# Patient Record
Sex: Female | Born: 2000 | Race: White | Hispanic: No | Marital: Single | Smoking: Never smoker
Health system: Southern US, Community
[De-identification: ages and names within clinical notes are randomized; demographics above are authoritative.]

## PROBLEM LIST (undated history)

## (undated) DIAGNOSIS — F329 Major depressive disorder, single episode, unspecified: Secondary | ICD-10-CM

## (undated) DIAGNOSIS — F32A Depression, unspecified: Secondary | ICD-10-CM

## (undated) HISTORY — DX: Major depressive disorder, single episode, unspecified: F32.9

## (undated) HISTORY — DX: Depression, unspecified: F32.A

---
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
## 2019-12-31 ENCOUNTER — Ambulatory Visit (INDEPENDENT_AMBULATORY_CARE_PROVIDER_SITE_OTHER): Payer: 59 | Admitting: Psychiatry

## 2019-12-31 ENCOUNTER — Other Ambulatory Visit: Payer: Self-pay

## 2019-12-31 ENCOUNTER — Encounter (HOSPITAL_COMMUNITY): Payer: Self-pay | Admitting: Psychiatry

## 2019-12-31 VITALS — Wt 142.0 lb

## 2019-12-31 DIAGNOSIS — F319 Bipolar disorder, unspecified: Secondary | ICD-10-CM

## 2019-12-31 DIAGNOSIS — F419 Anxiety disorder, unspecified: Secondary | ICD-10-CM | POA: Diagnosis not present

## 2019-12-31 MED ORDER — FLUOXETINE HCL 10 MG PO CAPS: ORAL_CAPSULE | ORAL | 0 refills | Status: DC

## 2019-12-31 MED ORDER — ARIPIPRAZOLE (SENSOR) 10 MG PO TABS: 10.0000 mg | ORAL_TABLET | Freq: Every day | ORAL | 0 refills | Status: DC

## 2019-12-31 NOTE — Progress Notes (Signed)
Virtual Visit via Video Note  I connected with Ann French on 12/31/19 at  9:00 AM EDT by a video enabled telemedicine application and verified that I am speaking with the correct person using two identifiers.   I discussed the limitations of evaluation and management by telemedicine and the availability of in person appointments. The patient expressed understanding and agreed to proceed.   [facility] Behavioral Health Initial Assessment Note  Ann French [id number] 19 y.o.  12/31/2019 9:44 AM  Chief Complaint:  I have a lot of anxiety and depression.  History of Present Illness:  Ann French is 19 year old Caucasian, single, employed female with a history of psychiatric symptoms referred by herself for seeking management.  Patient is struggled with mood swings, irritability, anxiety, depression since high school.  She took her medicine while she was in the high school from an [place] [facility] counseling but stopped after she felt that did not work.  Her symptoms started again last September when she noticed having extreme anxiety, severe mood swing, irritability and having crying spells with passive suicidal thoughts and feeling hopeless and helpless.  She also reported having manic symptoms which he described spending money, short temper, severe mood swings, yelling and screaming on the family members.  She had an argument with her grandpa while at the beach trip in October.  Patient reported she was supporting black life matters and argument that he took and she stopped and cut down her social network.  In January her boyfriend broke up because "he wanted space".  Patient recalled that she was begging for attention at the time.  She admitted easily emotional and highs and lows and get easily agitated and irritable.  She had spent almost all her savings and she regret about it.  Few months ago her PCP started her on Abilify and she is taking 5 mg.  Patient reported her mood swing irritability and  spinning is better but she still feels very anxious nervous depressed and having crying spells.  She sleep on and off.  Her energy level is low.  She feels hopeless and helpless and having passive suicidal thoughts but no plan or any intent.  She realized that she have underlying mood disorder.  She also reported drinking heavily sometimes with intoxication but denies any withdrawals, seizures.  She denies any other drug use.  Patient has a strong family history of psychiatric illness.  Her parents and brother has anxiety and depression.  She is hoping to start college again in fall after she dropped her semester last year.  She is moving in June at [place] in [place].  She is open to try medication adjustment.  Currently she is not seeing any therapist but agreed to see a counselor if needed.  Past Psychiatric History: History of depression, anxiety and mood symptoms since high school.  Seen [facility] counseling by and daily and therapist Beth and prescribed Lexapro for 1 year, Zoloft for 1 year and Effexor for 2 months.  She was also given Ritalin and trazodone by PCP.  She recalled none of these medicine help.  She is taking Abilify prescribed by PCP for past 2 months.  No history of suicidal attempt, inpatient.  History of drinking 2-3 times a week.  Admitted intoxication but denies seizures, blackouts, withdrawal, DUI.  Family History: Parents and brother has anxiety and depression.  Past Medical History:  Diagnosis Date  . Depression      Traumatic brain injury: No history of traumatic brain injury.  Work History; Looking at [employer].  Like her job.  Coworker are supportive.  Psychosocial History; Patient lives with her parents.  Recently boyfriend broke up in January 2021.  She is going to start school at [employer] in [place] coming fall.  She is hoping to move in June to have her own place.  Legal History; Denies any legal issues.  Substance Abuse History; History of  drinking 2-3 times a week.  History of intoxication but no blackouts, seizures, DUI or seizures.  Neurologic: Headache: Yes Seizure: No Paresthesias: No   Outpatient Encounter Medications as of 12/31/2019  Medication Sig  . ARIPiprazole (ABILIFY) 5 MG tablet Take 5 mg by mouth daily.  . medroxyPROGESTERone (DEPO-PROVERA) 150 MG/ML injection Inject 150 mg into the muscle every 3 (three) months.  . [DISCONTINUED] escitalopram (LEXAPRO) 10 MG tablet Take 10 mg by mouth daily.  . [DISCONTINUED] methylphenidate 18 MG PO CR tablet Take 18 mg by mouth daily.  . [DISCONTINUED] traZODone (DESYREL) 50 MG tablet Take 50 mg by mouth at bedtime.   No facility-administered encounter medications on file as of 12/31/2019.    No results found for this or any previous visit (from the past 2160 hour(s)).    Constitutional:  Wt 142 lb (64.4 kg)    Musculoskeletal: Strength & Muscle Tone: within normal limits Gait & Station: normal Patient leans: N/A  Psychiatric Specialty Exam: Physical Exam  ROS  Weight 142 lb (64.4 kg).There is no height or weight on file to calculate BMI.  General Appearance: Fairly Groomed and tearful, anxious  Eye Contact:  Fair  Speech:  Slow  Volume:  Decreased  Mood:  Anxious, Dysphoric, Euthymic and tearful  Affect:  Congruent  Thought Process:  Goal Directed  Orientation:  Full (Time, Place, and Person)  Thought Content:  Paranoid Ideation and Rumination  Suicidal Thoughts:  passive and fleeting thoughts but no plan or intent  Homicidal Thoughts:  No  Memory:  Immediate;   Good Recent;   Good Remote;   Good  Judgement:  Intact  Insight:  Present  Psychomotor Activity:  Decreased  Concentration:  Concentration: Fair and Attention Span: Fair  Recall:  Good  Fund of Knowledge:  Good  Language:  Good  Akathisia:  No  Handed:  Right  AIMS (if indicated):     Assets:  Communication Skills Desire for Improvement Housing Resilience Social  Support Transportation Vocational/Educational  ADL's:  Intact  Cognition:  WNL  Sleep:   4-5 hrs     Assessment and Plan: Patient is 19 year old Caucasian female with history of mood disorder and anxiety.  Currently taking Abilify 5 mg daily which is helping her manic symptoms but still having symptoms of severe anxiety and depression.  I recommend to try Abilify 10 mg and also add low-dose Prozac to help with his anxiety and depressive symptoms.  She has not seen PCP and has no blood work in a while.  I recommend to have the blood work which include CBC, CMP, TSH and hemoglobin A1c.  I also encourage to consider therapy to help her coping skills.  We also talk about stopping the alcohol as it may interfere with her psychotropic medication.  I discussed medication side effects in detail.  We discussed safety concerns and anytime having active suicidal thoughts or homicidal thought then she need to call 911 or go to local emergency room.  Follow-up in 3 weeks.  Follow Up Instructions:    I discussed the assessment and treatment  plan with the patient. The patient was provided an opportunity to ask questions and all were answered. The patient agreed with the plan and demonstrated an understanding of the instructions.   The patient was advised to call back or seek an in-person evaluation if the symptoms worsen or if the condition fails to improve as anticipated.  I provided 55 minutes of non-face-to-face time during this encounter.   Cleotis Nipper, MD

## 2020-01-14 ENCOUNTER — Telehealth (HOSPITAL_COMMUNITY): Payer: Self-pay | Admitting: *Deleted

## 2020-01-14 NOTE — Telephone Encounter (Signed)
Pt called with c/o muscle tightness constantly. Pt is recently increased form Abilify 5mg  to 10mg  starting 12/31/19. Please review and advise. Nurse will cal pt with any changes in or additional medication. Pt next appointment is on 01/24/20.

## 2020-01-17 ENCOUNTER — Other Ambulatory Visit (HOSPITAL_COMMUNITY): Payer: Self-pay | Admitting: *Deleted

## 2020-01-17 MED ORDER — BENZTROPINE MESYLATE 0.5 MG PO TABS: 0.5000 mg | ORAL_TABLET | Freq: Every day | ORAL | 2 refills | Status: DC

## 2020-01-17 NOTE — Telephone Encounter (Signed)
She can try cogentin 0.5 mg to help muscle tightness at bed time. If agree than please call her pharmacy. Thanks

## 2020-01-17 NOTE — Telephone Encounter (Signed)
Pt agrees. Rx sent to [facility] in [place].

## 2020-01-20 ENCOUNTER — Other Ambulatory Visit (HOSPITAL_COMMUNITY): Payer: Self-pay | Admitting: Psychiatry

## 2020-01-20 DIAGNOSIS — F319 Bipolar disorder, unspecified: Secondary | ICD-10-CM

## 2020-01-24 ENCOUNTER — Telehealth (INDEPENDENT_AMBULATORY_CARE_PROVIDER_SITE_OTHER): Payer: 59 | Admitting: Psychiatry

## 2020-01-24 ENCOUNTER — Other Ambulatory Visit (HOSPITAL_COMMUNITY): Payer: Self-pay | Admitting: Psychiatry

## 2020-01-24 ENCOUNTER — Other Ambulatory Visit: Payer: Self-pay

## 2020-01-24 ENCOUNTER — Encounter (HOSPITAL_COMMUNITY): Payer: Self-pay | Admitting: Psychiatry

## 2020-01-24 VITALS — Wt 140.0 lb

## 2020-01-24 DIAGNOSIS — F319 Bipolar disorder, unspecified: Secondary | ICD-10-CM

## 2020-01-24 DIAGNOSIS — F419 Anxiety disorder, unspecified: Secondary | ICD-10-CM

## 2020-01-24 MED ORDER — BENZTROPINE MESYLATE 0.5 MG PO TABS: 0.5000 mg | ORAL_TABLET | Freq: Every day | ORAL | 1 refills | Status: DC

## 2020-01-24 MED ORDER — ARIPIPRAZOLE (SENSOR) 10 MG PO TABS: 10.0000 mg | ORAL_TABLET | Freq: Every day | ORAL | 1 refills | Status: DC

## 2020-01-24 MED ORDER — FLUOXETINE HCL 20 MG PO CAPS: 20.0000 mg | ORAL_CAPSULE | Freq: Every day | ORAL | 1 refills | Status: DC

## 2020-01-24 MED ORDER — TRAZODONE HCL 50 MG PO TABS: 50.0000 mg | ORAL_TABLET | Freq: Every day | ORAL | 1 refills | Status: DC

## 2020-01-24 NOTE — Progress Notes (Signed)
Virtual Visit via Telephone Note  I connected with Ann French on 01/24/20 at  2:40 PM EDT by telephone and verified that I am speaking with the correct person using two identifiers.   I discussed the limitations, risks, security and privacy concerns of performing an evaluation and management service by telephone and the availability of in person appointments. I also discussed with the patient that there may be a patient responsible charge related to this service. The patient expressed understanding and agreed to proceed.   History of Present Illness: Patient is evaluated by phone session.  She is a 19 year old Caucasian single female who was seen first time 4 weeks ago.  She had a history of mood symptoms.  Her PCP started her on Abilify but she still struggled with depression, irritability, mood swings and having crying spells.  She has passive and fleeting suicidal thoughts and feeling hopeless.  She also had a manic symptoms which she describes spending money, short temper, yelling and screaming on family members.  We recommended to increase Abilify and also started low-dose Prozac.  She is doing much better she do not recall any suicidal thoughts or severe mood swings.  She still struggles with insomnia and sometimes she keeps up wakening.  However she reported no side effects.  She is moving to [place] so she can start technical college there.  She also has not started therapy because she like to see a counselor when she moved in.  She like to keep her current medication.  We also added recently Cogentin to help her muscle spasm and she feels it is working good.  However she like to restart trazodone to help her insomnia.  Her PCP had prescribed but she had to stop after having migraine headaches.  Now her headaches are well controlled and she like to go back on trazodone.  She had tried melatonin which did not work.   Past Psychiatric History: H/O depression, anxiety and mood symptoms since high  school.  Saw Manuela Neptune at [facility] counseling and Lexapro for 1 year, Zoloft for 1 year and Effexor for 2 months.  PCP prescribed Ritalin and trazodone. None helped.  Abilify helped. No h/o suicidal attempt, inpatient. H/O ETOH 2-3 times a week and h/o intoxication. No h/o seizures, blackouts, withdrawal, DUI.   Psychiatric Specialty Exam: Physical Exam  Review of Systems  There were no vitals taken for this visit.There is no height or weight on file to calculate BMI.  General Appearance: NA  Eye Contact:  NA  Speech:  Clear and Coherent  Volume:  Normal  Mood:  Euthymic  Affect:  NA  Thought Process:  Goal Directed  Orientation:  Full (Time, Place, and Person)  Thought Content:  Rumination  Suicidal Thoughts:  No  Homicidal Thoughts:  No  Memory:  Immediate;   Good Recent;   Good Remote;   Good  Judgement:  Intact  Insight:  Present  Psychomotor Activity:  NA  Concentration:  Concentration: Good and Attention Span: Good  Recall:  Good  Fund of Knowledge:  Good  Language:  Good  Akathisia:  No  Handed:  Right  AIMS (if indicated):     Assets:  Communication Skills Desire for Improvement Housing Resilience Vocational/Educational  ADL's:  Intact  Cognition:  WNL  Sleep:   frequent awaking       Assessment and Plan: Bipolar disorder type I.  Anxiety.  Patient doing better on her medication.  We will restart trazodone 50 mg to  help with insomnia.  She had tried over-the-counter melatonin with poor outcome.  She also had a blood work at her previous physician and she was told everything was normal.  Continue Prozac 20 mg daily, Cogentin 0.5 mg at bedtime and Abilify 10 mg daily.  She is working at [employer] and she likes her job.  Discussed medication side effects and benefits.  Recommended to call us back if she has any questions or any concerns.  Follow-up in 2 months.    Follow Up Instructions:    I discussed the assessment and treatment plan with the patient.  The patient was provided an opportunity to ask questions and all were answered. The patient agreed with the plan and demonstrated an understanding of the instructions.   The patient was advised to call back or seek an in-person evaluation if the symptoms worsen or if the condition fails to improve as anticipated.  I provided 20 minutes of non-face-to-face time during this encounter.   Kathlee Nations, MD

## 2020-02-16 ENCOUNTER — Other Ambulatory Visit (HOSPITAL_COMMUNITY): Payer: Self-pay | Admitting: Psychiatry

## 2020-02-16 DIAGNOSIS — F319 Bipolar disorder, unspecified: Secondary | ICD-10-CM

## 2020-02-16 DIAGNOSIS — F419 Anxiety disorder, unspecified: Secondary | ICD-10-CM

## 2020-03-12 ENCOUNTER — Other Ambulatory Visit (HOSPITAL_COMMUNITY): Payer: Self-pay | Admitting: Psychiatry

## 2020-03-12 DIAGNOSIS — F319 Bipolar disorder, unspecified: Secondary | ICD-10-CM

## 2020-03-12 DIAGNOSIS — F419 Anxiety disorder, unspecified: Secondary | ICD-10-CM

## 2020-03-30 ENCOUNTER — Telehealth (INDEPENDENT_AMBULATORY_CARE_PROVIDER_SITE_OTHER): Payer: 59 | Admitting: Psychiatry

## 2020-03-30 ENCOUNTER — Encounter (HOSPITAL_COMMUNITY): Payer: Self-pay | Admitting: Psychiatry

## 2020-03-30 ENCOUNTER — Other Ambulatory Visit: Payer: Self-pay

## 2020-03-30 DIAGNOSIS — F319 Bipolar disorder, unspecified: Secondary | ICD-10-CM | POA: Diagnosis not present

## 2020-03-30 DIAGNOSIS — F419 Anxiety disorder, unspecified: Secondary | ICD-10-CM | POA: Diagnosis not present

## 2020-03-30 MED ORDER — ARIPIPRAZOLE (SENSOR) 10 MG PO TABS: 10.0000 mg | ORAL_TABLET | Freq: Every day | ORAL | 0 refills | Status: DC

## 2020-03-30 MED ORDER — BENZTROPINE MESYLATE 0.5 MG PO TABS: 0.5000 mg | ORAL_TABLET | Freq: Every day | ORAL | 0 refills | Status: DC

## 2020-03-30 MED ORDER — FLUOXETINE HCL 20 MG PO CAPS: 20.0000 mg | ORAL_CAPSULE | Freq: Every day | ORAL | 0 refills | Status: DC

## 2020-03-30 MED ORDER — TRAZODONE HCL 50 MG PO TABS
50.0000 mg | ORAL_TABLET | Freq: Every day | ORAL | 0 refills | Status: DC
Start: 2020-03-30 — End: 2020-06-26

## 2020-03-30 NOTE — Progress Notes (Signed)
Virtual Visit via Telephone Note  I connected with Ann French on 03/30/20 at  2:40 PM EDT by telephone and verified that I am speaking with the correct person using two identifiers.  Location: Patient: home Provider: Home Office   I discussed the limitations, risks, security and privacy concerns of performing an evaluation and management service by telephone and the availability of in person appointments. I also discussed with the patient that there may be a patient responsible charge related to this service. The patient expressed understanding and agreed to proceed.   History of Present Illness: Patient is evaluated by phone session. She is taking trazodone which was added on the last visit to help sleep. She is doing better. She is moved to [place] and started working at [employer] and she really likes her job. She denies any irritability, mania, crying spells. She does not describe any manic symptoms or spending money. She is sleeping good. She is not seeing any therapist and does not feel she need one. She like to keep the current medication but is working well. Her appetite is okay. Her weight is unchanged from the past.  Past Psychiatric History: H/O depression, anxiety and mood symptoms since high school. Saw Manuela Neptune at [facility] and Lexapro for 1 year, Zoloft for 1 year and Effexor for 2 months. PCP prescribed Ritalin and trazodone. None helped. Abilify helped. No h/o suicidal attempt, inpatient. H/O ETOH 2-3 times a week and h/o intoxication. No h/o seizures, blackouts, withdrawal, DUI.    Psychiatric Specialty Exam: Physical Exam  Review of Systems  Weight 140 lb (63.5 kg).There is no height or weight on file to calculate BMI.  General Appearance: NA  Eye Contact:  NA  Speech:  Clear and Coherent  Volume:  Normal  Mood:  Euthymic  Affect:  NA  Thought Process:  Goal Directed  Orientation:  Full (Time, Place, and Person)  Thought Content:   Logical  Suicidal Thoughts:  No  Homicidal Thoughts:  No  Memory:  Immediate;   Good Recent;   Good Remote;   Good  Judgement:  Good  Insight:  Good  Psychomotor Activity:  NA  Concentration:  Concentration: Good and Attention Span: Good  Recall:  Good  Fund of Knowledge:  Good  Language:  Good  Akathisia:  No  Handed:  Right  AIMS (if indicated):     Assets:  Communication Skills Desire for Improvement Housing Resilience Social Support Talents/Skills Transportation  ADL's:  Intact  Cognition:  WNL  Sleep:   ok      Assessment and Plan: Bipolar disorder type I. Anxiety.  Patient is a stable on her current medication. She is sleeping better with trazodone. She has no tremors shakes or any EPS. Continue Cogentin 0.5 mg at bedtime, Abilify 10 mg daily, Prozac 20 mg daily and trazodone 50 mg at bedtime. Recommended to call us back if she has any question or any concern. Follow-up in 3 months.  Follow Up Instructions:    I discussed the assessment and treatment plan with the patient. The patient was provided an opportunity to ask questions and all were answered. The patient agreed with the plan and demonstrated an understanding of the instructions.   The patient was advised to call back or seek an in-person evaluation if the symptoms worsen or if the condition fails to improve as anticipated.  I provided 15 minutes of non-face-to-face time during this encounter.   Cleotis Nipper, MD

## 2020-05-31 DIAGNOSIS — F319 Bipolar disorder, unspecified: Secondary | ICD-10-CM | POA: Insufficient documentation

## 2020-06-18 ENCOUNTER — Other Ambulatory Visit (HOSPITAL_COMMUNITY): Payer: Self-pay | Admitting: Psychiatry

## 2020-06-18 DIAGNOSIS — F419 Anxiety disorder, unspecified: Secondary | ICD-10-CM

## 2020-06-18 DIAGNOSIS — F319 Bipolar disorder, unspecified: Secondary | ICD-10-CM

## 2020-06-26 ENCOUNTER — Other Ambulatory Visit: Payer: Self-pay

## 2020-06-26 ENCOUNTER — Telehealth (INDEPENDENT_AMBULATORY_CARE_PROVIDER_SITE_OTHER): Payer: 59 | Admitting: Psychiatry

## 2020-06-26 ENCOUNTER — Encounter (HOSPITAL_COMMUNITY): Payer: Self-pay | Admitting: Psychiatry

## 2020-06-26 VITALS — Wt 150.0 lb

## 2020-06-26 DIAGNOSIS — F419 Anxiety disorder, unspecified: Secondary | ICD-10-CM | POA: Diagnosis not present

## 2020-06-26 DIAGNOSIS — F319 Bipolar disorder, unspecified: Secondary | ICD-10-CM | POA: Diagnosis not present

## 2020-06-26 MED ORDER — TRAZODONE HCL 100 MG PO TABS: 100.0000 mg | ORAL_TABLET | Freq: Every day | ORAL | 1 refills | Status: DC

## 2020-06-26 MED ORDER — FLUOXETINE HCL 10 MG PO CAPS: 30.0000 mg | ORAL_CAPSULE | Freq: Every day | ORAL | 1 refills | Status: DC

## 2020-06-26 MED ORDER — ARIPIPRAZOLE 10 MG PO TABS: 10.0000 mg | ORAL_TABLET | Freq: Every day | ORAL | 1 refills | Status: DC

## 2020-06-26 MED ORDER — BENZTROPINE MESYLATE 0.5 MG PO TABS: 0.5000 mg | ORAL_TABLET | Freq: Every day | ORAL | 1 refills | Status: DC

## 2020-06-26 NOTE — Progress Notes (Signed)
Virtual Visit via Telephone Note  I connected with Ann French on 06/26/20 at  9:00 AM EDT by telephone and verified that I am speaking with the correct person using two identifiers.  Location: Patient: home Provider: home office   I discussed the limitations, risks, security and privacy concerns of performing an evaluation and management service by telephone and the availability of in person appointments. I also discussed with the patient that there may be a patient responsible charge related to this service. The patient expressed understanding and agreed to proceed.   History of Present Illness: Patient is evaluated by phone session.  Patient told that last month she was state briefly at [facility] after having suicidal thoughts and she wanted to help.  It was a voluntary inpatient and she called the crisis hotline because she was ready depressed.  Patient told at that time she has a lot of stress from job, missing home, school.  Her grades were declining and she was very concerned.  Patient reported no medicines were changed but now she is seeing a therapist through [facility] and she has at least 3 sessions.  She is feeling better.  She had cut down her job hours and only working 20 to 30 hours.  Before she was working more than 30 hours.  She works at [employer].  She is also at school and she was thinking about dropping the classes because her grades were not improving.  She recently had fall break and she came back to [facility] to stay with her parents and she is feeling better but tomorrow she is going back to campus.  She has some anxiety and nervousness.  She is no longer suicidal thoughts.  She feels therapist help some nights she is still struggling with insomnia, ruminative thoughts.  She feels sometimes ignored and feel alone because she tried to make friends and so far unsuccessful.  She started seeing a man however it is very early and she is not sure how it will go.  She told  the person place football at [employer] state and sometimes difficult to see him.  Patient still have some ruminative thoughts but no paranoia, hallucination, suicidal thoughts.  Her appetite is okay.  She has no tremors, shakes or any EPS.  She is compliant with trazodone, Cogentin, Abilify and Prozac.  She admitted sometimes irritability and mood swings but it is not as bad.  She denies drinking or using any illegal substances.  Her U tox was positive for cannabinoids and she admitted that she was using delta 8 from over-the-counter.  She started this 2 months ago.  Past Psychiatric History: H/Odepression, anxiety and mood symptoms since high school. [facility] Zoloft for 1 year and Effexor for 2 months.PCP prescribedRitalin and trazodone. None helped.Abilify helped.H/O brief inpatient at [facility] in Sept 2021 for S/I but no h/o suicidal attempt and paranoia. H/O ETOH2-3 times a weekand h/ointoxication. No h/oseizures, blackouts, withdrawal, DUI.   Psychiatric Specialty Exam: Physical Exam  Review of Systems  Weight 150 lb (68 kg).There is no height or weight on file to calculate BMI.  General Appearance: NA  Eye Contact:  NA  Speech:  Normal Rate  Volume:  Normal  Mood:  Depressed and Irritable  Affect:  NA  Thought Process:  Goal Directed  Orientation:  Full (Time, Place, and Person)  Thought Content:  Rumination  Suicidal Thoughts:  No  Homicidal Thoughts:  No  Memory:  Immediate;   Good Recent;   Good Remote;   Good  Judgement:  Fair  Insight:  Fair  Psychomotor Activity:  NA  Concentration:  Concentration: Fair and Attention Span: Fair  Recall:  Good  Fund of Knowledge:  Good  Language:  Good  Akathisia:  No  Handed:  Right  AIMS (if indicated):     Assets:  Communication Skills Desire for Improvement Housing Social Support Transportation  ADL's:  Intact  Cognition:  WNL  Sleep:   better      Assessment and  Plan: Bipolar disorder type I.  Anxiety.  I reviewed blood work results and notes from recent hospitalization.  No meds were changed but now she started seeing a therapist once a week.  I encouraged that she should continue since it does help her coping skills.  We talked about stopping cannabinoids from over-the-counter as she started 2 months ago and we noticed slowly and gradually increasing anxiety.  I recommend to try Prozac 30 mg and trazodone 100 mg to help her insomnia, result anxiety and depression.  Her blood work from last hospitalization is normal.  Her CBC, CMP is normal.  Continue Abilify 10 mg daily, we will try Prozac 30 mg, trazodone 100 mg and she will remain on Cogentin 0.5 mg at bedtime.  Discussed safety concerns and anytime having active suicidal thoughts or homicidal thought that she need to call 911 or go to local emergency room.  Follow-up in 4 weeks.  Follow Up Instructions:    I discussed the assessment and treatment plan with the patient. The patient was provided an opportunity to ask questions and all were answered. The patient agreed with the plan and demonstrated an understanding of the instructions.   The patient was advised to call back or seek an in-person evaluation if the symptoms worsen or if the condition fails to improve as anticipated.  I provided 25 minutes of non-face-to-face time during this encounter.   Cleotis Nipper, MD

## 2020-07-14 ENCOUNTER — Other Ambulatory Visit (HOSPITAL_COMMUNITY): Payer: Self-pay | Admitting: Psychiatry

## 2020-07-14 DIAGNOSIS — F319 Bipolar disorder, unspecified: Secondary | ICD-10-CM

## 2020-07-14 DIAGNOSIS — F419 Anxiety disorder, unspecified: Secondary | ICD-10-CM

## 2020-07-20 ENCOUNTER — Other Ambulatory Visit (HOSPITAL_COMMUNITY): Payer: Self-pay | Admitting: Psychiatry

## 2020-07-20 DIAGNOSIS — F319 Bipolar disorder, unspecified: Secondary | ICD-10-CM

## 2020-07-24 ENCOUNTER — Encounter (HOSPITAL_COMMUNITY): Payer: Self-pay | Admitting: Psychiatry

## 2020-07-24 ENCOUNTER — Ambulatory Visit (INDEPENDENT_AMBULATORY_CARE_PROVIDER_SITE_OTHER): Payer: 59 | Admitting: Psychiatry

## 2020-07-24 ENCOUNTER — Other Ambulatory Visit: Payer: Self-pay

## 2020-07-24 DIAGNOSIS — F419 Anxiety disorder, unspecified: Secondary | ICD-10-CM

## 2020-07-24 DIAGNOSIS — F319 Bipolar disorder, unspecified: Secondary | ICD-10-CM

## 2020-07-24 MED ORDER — ARIPIPRAZOLE 10 MG PO TABS: 10.0000 mg | ORAL_TABLET | Freq: Every day | ORAL | 1 refills | Status: DC

## 2020-07-24 MED ORDER — TRAZODONE HCL 100 MG PO TABS: 100.0000 mg | ORAL_TABLET | Freq: Every day | ORAL | 1 refills | Status: DC

## 2020-07-24 MED ORDER — BENZTROPINE MESYLATE 0.5 MG PO TABS: 0.5000 mg | ORAL_TABLET | Freq: Every day | ORAL | 1 refills | Status: DC

## 2020-07-24 MED ORDER — FLUOXETINE HCL 10 MG PO CAPS: 30.0000 mg | ORAL_CAPSULE | Freq: Every day | ORAL | 1 refills | Status: DC

## 2020-07-24 NOTE — Progress Notes (Signed)
Virtual Visit via Telephone Note  I connected with Ann French on 07/24/20 at 11:20 AM EST by telephone and verified that I am speaking with the correct person using two identifiers.  Location: Patient: Home Provider: Home Office   I discussed the limitations, risks, security and privacy concerns of performing an evaluation and management service by telephone and the availability of in person appointments. I also discussed with the patient that there may be a patient responsible charge related to this service. The patient expressed understanding and agreed to proceed.   History of Present Illness: Patient is evaluated by phone session.  She is now moved in with her parents in [place].  She is working part-time at [employer] in [place].  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  She feels things are going much better.  On the last visit we increased Prozac 30 mg and she noticed improvement in her mood and depression.  Sometimes she is still struggle with insomnia but she reported her mood has been improved.  She is in [facility].  Patient told her new relationship ended but she does not feel as sad and denies any crying spells or any feeling of hopelessness.  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  She denies any panic attack, anxiety attack.  Her energy level is okay.  She denies drinking or using any illegal substances.  Her appetite is okay.  Her weight is stable.  She is taking Abilify, Cogentin, Prozac and trazodone.  She denies any tremors shakes or any EPS.    Past Psychiatric History: H/Odepression, anxiety and mood symptoms since high school. [facility] Z[facility] for 1 year and Effexor for 2 months.PCP prescribedRitalin and trazodone. None helped.Abilify helped.H/O brief inpatient at [facility] in  Sept 2021 for S/I but no h/o suicidal attempt and paranoia. H/O ETOH2-3 times a weekand h/ointoxication. No h/oseizures, blackouts, withdrawal, DUI.  Psychiatric Specialty Exam: Physical Exam  Review of Systems  Weight 150 lb (68 kg).There is no height or weight on file to calculate BMI.  General Appearance: NA  Eye Contact:  NA  Speech:  Clear and Coherent  Volume:  Normal  Mood:  Euthymic  Affect:  NA  Thought Process:  Goal Directed  Orientation:  Full (Time, Place, and Person)  Thought Content:  Logical  Suicidal Thoughts:  No  Homicidal Thoughts:  No  Memory:  Immediate;   Good Recent;   Good Remote;   Good  Judgement:  Intact  Insight:  Present  Psychomotor Activity:  NA  Concentration:  Concentration: Good and Attention Span: Good  Recall:  Good  Fund of Knowledge:  Good  Language:  Good  Akathisia:  No  Handed:  Right  AIMS (if indicated):     Assets:  Communication Skills Desire for Improvement Housing Resilience Social Support Talents/Skills Transportation  ADL's:  Intact  Cognition:  WNL  Sleep:   fair      Assessment and Plan: Bipolar disorder type I.  Anxiety.  Patient is a stable on her current medication.  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  She is not sure if she wants to continue school for now.  She had a supportive parents where she is living.  She feels increased  dose of Prozac helps her mood and anxiety.  Discussed medication side effects and benefits.  Continue trazodone 100 mg at bedtime, Cogentin 0.5 mg at bedtime, Prozac 20 mg daily and Abilify 10 mg daily.  Discussed medication side effects and benefits.  If she continues to have insomnia then we may consider adjusting the dose of trazodone or try something different.  Also encouraged to keep appointment [facility].  Recommended to call us back if is any question or any concern.  Follow-up in 2  months.  Follow Up Instructions:    I discussed the assessment and treatment plan with the patient. The patient was provided an opportunity to ask questions and all were answered. The patient agreed with the plan and demonstrated an understanding of the instructions.   The patient was advised to call back or seek an in-person evaluation if the symptoms worsen or if the condition fails to improve as anticipated.  I provided 18 minutes of non-face-to-face time during this encounter.   Cleotis Nipper, MD

## 2020-07-27 ENCOUNTER — Ambulatory Visit (HOSPITAL_COMMUNITY): Payer: 59 | Admitting: Psychiatry

## 2020-08-08 ENCOUNTER — Other Ambulatory Visit (HOSPITAL_COMMUNITY): Payer: Self-pay | Admitting: *Deleted

## 2020-08-08 ENCOUNTER — Other Ambulatory Visit (HOSPITAL_COMMUNITY): Payer: Self-pay | Admitting: Psychiatry

## 2020-08-08 ENCOUNTER — Telehealth (HOSPITAL_COMMUNITY): Payer: Self-pay | Admitting: *Deleted

## 2020-08-08 DIAGNOSIS — F419 Anxiety disorder, unspecified: Secondary | ICD-10-CM

## 2020-08-08 DIAGNOSIS — F319 Bipolar disorder, unspecified: Secondary | ICD-10-CM

## 2020-08-08 MED ORDER — FLUOXETINE HCL 10 MG PO CAPS: 30.0000 mg | ORAL_CAPSULE | Freq: Every day | ORAL | 0 refills | Status: DC

## 2020-08-08 NOTE — Telephone Encounter (Signed)
Ok. Please call for 90 days. Thanks

## 2020-08-08 NOTE — Telephone Encounter (Signed)
Pt called requesting 90 day supply of the Prozac due to insurance. Pt has an appointment upcoming on 10/18/20.

## 2020-08-16 ENCOUNTER — Other Ambulatory Visit (HOSPITAL_COMMUNITY): Payer: Self-pay | Admitting: Psychiatry

## 2020-08-16 DIAGNOSIS — F319 Bipolar disorder, unspecified: Secondary | ICD-10-CM

## 2020-09-19 ENCOUNTER — Other Ambulatory Visit (HOSPITAL_COMMUNITY): Payer: Self-pay | Admitting: Psychiatry

## 2020-09-19 DIAGNOSIS — F319 Bipolar disorder, unspecified: Secondary | ICD-10-CM

## 2020-09-23 ENCOUNTER — Other Ambulatory Visit (HOSPITAL_COMMUNITY): Payer: Self-pay | Admitting: Psychiatry

## 2020-09-23 DIAGNOSIS — F319 Bipolar disorder, unspecified: Secondary | ICD-10-CM

## 2020-10-09 ENCOUNTER — Other Ambulatory Visit (HOSPITAL_COMMUNITY): Payer: Self-pay | Admitting: *Deleted

## 2020-10-09 DIAGNOSIS — F319 Bipolar disorder, unspecified: Secondary | ICD-10-CM

## 2020-10-09 DIAGNOSIS — F419 Anxiety disorder, unspecified: Secondary | ICD-10-CM

## 2020-10-09 MED ORDER — BENZTROPINE MESYLATE 0.5 MG PO TABS: 0.5000 mg | ORAL_TABLET | Freq: Every day | ORAL | 0 refills | Status: DC

## 2020-10-09 MED ORDER — ARIPIPRAZOLE 10 MG PO TABS: 10.0000 mg | ORAL_TABLET | Freq: Every day | ORAL | 0 refills | Status: DC

## 2020-10-09 MED ORDER — TRAZODONE HCL 100 MG PO TABS: 100.0000 mg | ORAL_TABLET | Freq: Every day | ORAL | 0 refills | Status: DC

## 2020-10-09 MED ORDER — FLUOXETINE HCL 10 MG PO CAPS: 30.0000 mg | ORAL_CAPSULE | Freq: Every day | ORAL | 0 refills | Status: DC

## 2020-10-12 ENCOUNTER — Other Ambulatory Visit (HOSPITAL_COMMUNITY): Payer: Self-pay | Admitting: Psychiatry

## 2020-10-12 DIAGNOSIS — F419 Anxiety disorder, unspecified: Secondary | ICD-10-CM

## 2020-10-16 ENCOUNTER — Other Ambulatory Visit (HOSPITAL_COMMUNITY): Payer: Self-pay | Admitting: Psychiatry

## 2020-10-16 DIAGNOSIS — F319 Bipolar disorder, unspecified: Secondary | ICD-10-CM

## 2020-10-18 ENCOUNTER — Encounter (HOSPITAL_COMMUNITY): Payer: Self-pay

## 2020-10-18 ENCOUNTER — Encounter (HOSPITAL_COMMUNITY): Payer: 59 | Admitting: Psychiatry

## 2020-10-18 ENCOUNTER — Other Ambulatory Visit: Payer: Self-pay

## 2020-10-18 NOTE — Progress Notes (Signed)
No show

## 2020-10-22 ENCOUNTER — Other Ambulatory Visit (HOSPITAL_COMMUNITY): Payer: Self-pay | Admitting: Psychiatry

## 2020-10-22 DIAGNOSIS — F319 Bipolar disorder, unspecified: Secondary | ICD-10-CM

## 2020-10-23 ENCOUNTER — Telehealth (INDEPENDENT_AMBULATORY_CARE_PROVIDER_SITE_OTHER): Payer: 59 | Admitting: Psychiatry

## 2020-10-23 ENCOUNTER — Other Ambulatory Visit: Payer: Self-pay

## 2020-10-23 ENCOUNTER — Encounter (HOSPITAL_COMMUNITY): Payer: Self-pay | Admitting: Psychiatry

## 2020-10-23 DIAGNOSIS — F419 Anxiety disorder, unspecified: Secondary | ICD-10-CM

## 2020-10-23 DIAGNOSIS — F319 Bipolar disorder, unspecified: Secondary | ICD-10-CM | POA: Diagnosis not present

## 2020-10-23 MED ORDER — ARIPIPRAZOLE 10 MG PO TABS: 10.0000 mg | ORAL_TABLET | Freq: Every day | ORAL | 0 refills | Status: AC

## 2020-10-23 MED ORDER — BENZTROPINE MESYLATE 0.5 MG PO TABS: 0.5000 mg | ORAL_TABLET | Freq: Every day | ORAL | 0 refills | Status: AC

## 2020-10-23 MED ORDER — FLUOXETINE HCL 10 MG PO CAPS: 30.0000 mg | ORAL_CAPSULE | Freq: Every day | ORAL | 0 refills | Status: AC

## 2020-10-23 MED ORDER — TRAZODONE HCL 100 MG PO TABS: 100.0000 mg | ORAL_TABLET | Freq: Every day | ORAL | 0 refills | Status: AC

## 2020-10-23 NOTE — Progress Notes (Signed)
Virtual Visit via Telephone Note  I connected with Ann French on 10/23/20 at  3:20 PM EST by telephone and verified that I am speaking with the correct person using two identifiers.  Location: Patient: Work Provider: Economist   I discussed the limitations, risks, security and privacy concerns of performing an evaluation and management service by telephone and the availability of in person appointments. I also discussed with the patient that there may be a patient responsible charge related to this service. The patient expressed understanding and agreed to proceed.   History of Present Illness: Patient is evaluated by phone session.  She is now working full-time at [employer].  Patient is sad because her 54 years old grandfather died 10 days ago.  Patient was in the building very sad and did not talk too much but now she is trying to help her father and other family members.  She is sleeping good.  She denies any suicidal thoughts or homicidal thoughts.  She denies any panic attack.  She describes her mood is stable and she denies any mania, psychosis or any hallucination.  Her energy level is good.  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  She is sleeping good with the help of trazodone.  She has no tremors, shakes or any EPS.  She also started therapy with her previous therapist Angelina Ok in [place] by video.  Patient feels that is working well.  She is currently not in any relationship and staying with her parents were very supportive.  She denies drinking or using any illegal substances.   Past Psychiatric History: H/Odepression, anxiety and mood symptoms since high school. [facility], Zoloft for 1 year and Effexor for 2 months.PCP prescribedRitalin and trazodone. None helped.Abilify helped.H/O brief inpatient  at [facility] in Sept 2021 for S/I but no h/osuicidal attempt and paranoia.H/O ETOH2-3 times a weekand h/ointoxication. No h/oseizures, blackouts, withdrawal, DUI.   Psychiatric Specialty Exam: Physical Exam  Review of Systems  There were no vitals taken for this visit.There is no height or weight on file to calculate BMI.  General Appearance: NA  Eye Contact:  NA  Speech:  Clear and Coherent  Volume:  Normal  Mood:  Dysphoric  Affect:  NA  Thought Process:  Goal Directed  Orientation:  Full (Time, Place, and Person)  Thought Content:  Logical  Suicidal Thoughts:  No  Homicidal Thoughts:  No  Memory:  Immediate;   Good Recent;   Good Remote;   Good  Judgement:  Good  Insight:  Present  Psychomotor Activity:  NA  Concentration:  Concentration: Good and Attention Span: Good  Recall:  Good  Fund of Knowledge:  Good  Language:  Good  Akathisia:  No  Handed:  Right  AIMS (if indicated):     Assets:  Communication Skills Desire for Improvement Housing Resilience Social Support Talents/Skills Transportation  ADL's:  Intact  Cognition:  WNL  Sleep:   ok      Assessment and Plan: Bipolar disorder type I.  Anxiety.  Patient is a stable on her current medication.  She is now working full-time for [employer] and she really like her job.  She does not want to change the medication.  I offered grief counseling since recently she had lost her 22 year old grandfather but  does not feel she needed as she is already seeing and started therapy with her previous counselor.  Discussed medication side effects and benefits.  Continue trazodone 100 mg at bedtime, Cogentin 0.5 mg at bedtime, Prozac 30 mg daily and Abilify 10 mg daily.  Recommended to call us back if is any question or any concern.  Follow-up in 3 months.  Follow Up Instructions:    I discussed the assessment and treatment plan with the patient. The patient was provided an opportunity to ask questions and all were  answered. The patient agreed with the plan and demonstrated an understanding of the instructions.   The patient was advised to call back or seek an in-person evaluation if the symptoms worsen or if the condition fails to improve as anticipated.  I provided 17 minutes of non-face-to-face time during this encounter.   Cleotis Nipper, MD

## 2020-10-31 ENCOUNTER — Other Ambulatory Visit (HOSPITAL_COMMUNITY)
Admission: RE | Admit: 2020-10-31 | Discharge: 2020-10-31 | Disposition: A | Discharge status: Home / Self Care | Payer: 59 | Source: Ambulatory Visit | Attending: Family Medicine | Admitting: Family Medicine

## 2020-10-31 ENCOUNTER — Other Ambulatory Visit: Payer: Self-pay | Admitting: Family Medicine

## 2020-10-31 DIAGNOSIS — Z01411 Encounter for gynecological examination (general) (routine) with abnormal findings: Secondary | ICD-10-CM | POA: Insufficient documentation

## 2020-11-03 ENCOUNTER — Other Ambulatory Visit (HOSPITAL_COMMUNITY): Payer: Self-pay | Admitting: Psychiatry

## 2020-11-03 DIAGNOSIS — F319 Bipolar disorder, unspecified: Secondary | ICD-10-CM

## 2020-11-03 LAB — CYTOLOGY - PAP
Chlamydia: NEGATIVE
Comment: NEGATIVE
Comment: NORMAL
Diagnosis: NEGATIVE
Neisseria Gonorrhea: NEGATIVE

## 2021-01-16 ENCOUNTER — Telehealth (HOSPITAL_COMMUNITY): Payer: 59 | Admitting: Psychiatry
